# Patient Record
Sex: Male | Born: 1970 | Race: Black or African American | Hispanic: No | Marital: Single | Smoking: Current every day smoker
Health system: Southern US, Community
[De-identification: ages and names within clinical notes are randomized; demographics above are authoritative.]

---

## 2003-12-19 ENCOUNTER — Emergency Department (HOSPITAL_COMMUNITY): Admission: EM | Admit: 2003-12-19 | Discharge: 2003-12-19 | Payer: Self-pay | Admitting: Emergency Medicine

## 2004-05-18 ENCOUNTER — Emergency Department (HOSPITAL_COMMUNITY): Admission: EM | Admit: 2004-05-18 | Discharge: 2004-05-18 | Payer: Self-pay
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
## 2013-09-27 ENCOUNTER — Emergency Department (HOSPITAL_COMMUNITY)
Admission: EM | Admit: 2013-09-27 | Discharge: 2013-09-27 | Disposition: A | Discharge status: Home / Self Care | Payer: Self-pay

## 2013-09-27 ENCOUNTER — Encounter (HOSPITAL_COMMUNITY): Payer: Self-pay | Admitting: Emergency Medicine

## 2013-09-27 DIAGNOSIS — K047 Periapical abscess without sinus: Secondary | ICD-10-CM | POA: Insufficient documentation

## 2013-09-27 DIAGNOSIS — F17200 Nicotine dependence, unspecified, uncomplicated: Secondary | ICD-10-CM | POA: Insufficient documentation

## 2013-09-27 MED ORDER — OXYCODONE-ACETAMINOPHEN 5-325 MG PO TABS
2.0000 | ORAL_TABLET | Freq: Once | ORAL | Status: AC
  Administered 2013-09-27: 2 via ORAL
  Filled 2013-09-27: qty 2

## 2013-09-27 MED ORDER — OXYCODONE-ACETAMINOPHEN 5-325 MG PO TABS: 1.0000 | ORAL_TABLET | ORAL | Status: DC | PRN

## 2013-09-27 MED ORDER — PENICILLIN V POTASSIUM 500 MG PO TABS: 500.0000 mg | ORAL_TABLET | Freq: Four times a day (QID) | ORAL | Status: DC

## 2013-09-27 MED ORDER — IBUPROFEN 800 MG PO TABS: 800.0000 mg | ORAL_TABLET | Freq: Three times a day (TID) | ORAL | Status: DC

## 2013-09-27 NOTE — Progress Notes (Signed)
[facility] CL provided pt with a list of primary care resources, [place] [facility] application, and dental resources, to help patient establish primary care.

## 2013-09-27 NOTE — ED Provider Notes (Signed)
CSN: [id number]     Arrival date & time 09/27/13  1316 History  This chart was scribed for non-physician practitioner, Clabe SealLauren M Aneesh Faller, PA-C, working with Richardean Canalavid H Yao, MD by Charline BillsEssence Howell, ED Scribe. This patient was seen in room WTR8/WTR8 and the patient's care was started at 3:53 PM.   Chief Complaint  Patient presents with  . Dental Pain   HPI Comments: Eric GoltzWendell Denny is a 43 y.o. male who presents to the Emergency Department complaining of lower right dental pain onset 2 days ago. Pt reports associated facial swelling Pt suspects cavity in associated tooth. Pt has a dental appointment tomorrow. Pt has taken 800 mg ibuprofen for pain. No known allergies.   The history is provided by the patient. No language interpreter was used.   History reviewed. No pertinent past medical history. History reviewed. No pertinent past surgical history. History reviewed. No pertinent family history. History  Substance Use Topics  . Smoking status: Current Every Day Smoker  . Smokeless tobacco: Not on file  . Alcohol Use: No    Review of Systems  Constitutional: Negative for fever and chills.  HENT: Positive for dental problem and facial swelling.   Gastrointestinal: Negative for nausea and vomiting.  All other systems reviewed and are negative.  Allergies  Review of patient's allergies indicates no known allergies.  Home Medications   Prior to Admission medications   Medication Sig Start Date End Date Taking? Authorizing Provider  ibuprofen (ADVIL,MOTRIN) 200 MG tablet Take 800 mg by mouth.   Yes Historical Provider, MD   Triage Vitals: BP 119/86  Pulse 111  Temp(Src) 98.4 F (36.9 C) (Oral)  Resp 20  SpO2 100% Physical Exam  Nursing note and vitals reviewed. Constitutional: He is oriented to person, place, and time. He appears well-developed and well-nourished. He is cooperative.  Non-toxic appearance. He does not have a sickly appearance. He does not appear ill. No distress.  HENT:   Head: Normocephalic and atraumatic.  Mouth/Throat: Uvula is midline and oropharynx is clear and moist. No trismus in the jaw. No oropharyngeal exudate.  Tooth 31 gumline tender to palpation. Assoicated lower right dental swelling, no pointing or obvious drainage. Multiple fractured teeth.  Eyes: EOM are normal.  Neck: Normal range of motion. Neck supple.  Pulmonary/Chest: Effort normal. No respiratory distress.  Musculoskeletal: Normal range of motion.  Lymphadenopathy:    He has no cervical adenopathy.  Neurological: He is alert and oriented to person, place, and time.  Skin: Skin is warm and dry. He is not diaphoretic.  Psychiatric: He has a normal mood and affect. His behavior is normal.   ED Course  Procedures (including critical care time)   MDM   Final diagnoses:  Dental abscess   Patient with tooth pain and associated swelling to right lower jaw. No obvious pointing or fluctuance to palpation. Exam unconcerning for Ludwig's angina or spread of infection.  Will treat with penicillin and pain medicine.  Urged patient to follow-up with dentist, has an appointment tomorrow for further evaluation with a dentist.  Meds given in ED:  Medications  oxyCODONE-acetaminophen (PERCOCET/ROXICET) 5-325 MG per tablet 2 tablet (2 tablets Oral Given 09/27/13 1607)    Discharge Medication List as of 09/27/2013  4:02 PM    START taking these medications   Details  !! ibuprofen (ADVIL,MOTRIN) 800 MG tablet Take 1 tablet (800 mg total) by mouth 3 (three) times daily., Starting 09/27/2013, Until Discontinued, Print    oxyCODONE-acetaminophen (PERCOCET/ROXICET) 5-325 MG per  tablet Take 1 tablet by mouth every 4 (four) hours as needed for severe pain. May take 2 tablets PO q 6 hours for severe pain - Do not take with Tylenol as this tablet already contains tylenol, Starting 09/27/2013, Until Discontinued, Print    penicillin v potassium (VEETID) 500 MG tablet Take 1 tablet (500 mg total) by mouth  4 (four) times daily., Starting 09/27/2013, Until Discontinued, Print     !! - Potential duplicate medications found. Please discuss with provider.      I personally performed the services described in this documentation, which was scribed in my presence. The recorded information has been reviewed and is accurate.    Clabe SealLauren M Orell Hurtado, PA-C 09/29/13 [phone]

## 2013-09-27 NOTE — Discharge Instructions (Signed)
Call today for an appointment Dr. Mayford Knifeurner or another dentist. Call for a follow up appointment with a Family or Primary Care Provider.  Return if Symptoms worsen.   Take medication as prescribed.   Note that your pain medication contains acetaminophen (Tylenol) & its is not recommended that you use additional acetaminophen (Tylenol) while taking this medication.  Emergency Department Resource Guide 1) Find a Doctor and Pay Out of Pocket Although you won't have to find out who is covered by your insurance plan, it is a good idea to ask around and get recommendations. You will then need to call the office and see if the doctor you have chosen will accept you as a new patient and what types of options they offer for patients who are self-pay. Some doctors offer discounts or will set up payment plans for their patients who do not have insurance, but you will need to ask so you aren't surprised when you get to your appointment.  2) Contact Your Local Health Department Not all health departments have doctors that can see patients for sick visits, but many do, so it is worth a call to see if yours does. If you don't know where your local health department is, you can check in your phone book. The CDC also has a tool to help you locate your state's health department, and many state websites also have listings of all of their local health departments.  3) Find a Walk-in Clinic If your illness is not likely to be very severe or complicated, you may want to try a walk in clinic. These are popping up all over the country in pharmacies, drugstores, and shopping centers. They're usually staffed by nurse practitioners or physician assistants that have been trained to treat common illnesses and complaints. They're usually fairly quick and inexpensive. However, if you have serious medical issues or chronic medical problems, these are probably not your best option.  No Primary Care Doctor: - Call Health Connect at   [phone] - they can help you locate a primary care doctor that  accepts your insurance, provides certain services, etc. - Physician Referral Service- [phone]  Chronic Pain Problems: Organization         Address  Phone   Notes  [facility]  [phone]) [phone] Patients need to be referred by their primary care doctor.   Medication Assistance: Organization         Address  Phone   Notes  [facility] Medication [facility] [address] [place], [place] [address] [phone]) [phone] --Must be a resident of [facility] [place] -- Must have NO insurance coverage whatsoever (no Medicaid/ Medicare, etc.) -- The pt. MUST have a primary care doctor that directs their care regularly and follows them in the community   MedAssist  [phone]   [employer]  [phone]) [phone]    Agencies that provide inexpensive medical care: Organization         Address  Phone   Notes  [facility]  [phone]   [facility]    [phone]   [facility] [address] [place], [place] [address] [phone]   [facility] [address], [place] [phone]   Planned Parenthood    [phone]   [facility]    [phone](336) [phone]   [facility] and [facility]  [address], [place] Phone:  [phone], Fax:  (  [phone] Hours of Operation:  9 am - 6 pm, M-F.  Also accepts Medicaid/Medicare and self-pay.  [facility] [place] [address], [address], [place] Phone: [phone], Fax: [phone]. Hours of Operation:  8:30 am - 5:30 pm, M-F.  Also accepts Medicaid and self-pay.  [facility] [place] [address] [place] Phone: [phone]   [place], [place], [place] [phone], Ext. [phone] Mondays & Thursdays: 7-9 AM.  First 15 patients are seen on a first come, first serve basis.     [place] Providers:  Organization         Address  Phone   Notes  [facility] [address], [place] [phone] Also accepts self-pay patients.  [facility] [address], [place]  [phone]   [place], [address], [place] [phone]   [facility] [address], [place] [phone]   Lucianne Lei [address], [place]   [phone] Only accepts [place] Access [place] patients after they have their name applied to their card.   Self-Pay (no insurance) in [facility]:  Organization         Address  Phone   Notes  Sickle Cell Patients, [facility] [place] [phone]   [facility] [place] [phone]   [facility] [place]  [place], [place], [place] [phone]   [facility]/Dr. Osei-Bonsu  [address] [place] or [address], [place] [phone] Phone number for both [place] and [place] locations is the same.  [facility] ([facility]-Rh) [address], [place] [phone]   [facility] [address], [place] or [address] [phone]   [facility] [address] [place] [phone], phone; [phone], fax Sees patients 1st and 3rd Saturday of every month.  Must not qualify for public or private insurance (i.e. Medicaid, Medicare, [place] Health Choice, Veterans' Benefits)  Household income should be no more than 200% of the poverty level The clinic cannot treat you if you are pregnant or think you are pregnant  Sexually transmitted diseases are not treated at the clinic.    Dental Care: Organization         Address  Phone  Notes  [facility]  Department of [facility] [phone] Accepts children up to age 76 who are enrolled in [place] or [place]; pregnant women with a Medicaid card; and children who have applied for Medicaid or [place] Health Choice, but were declined, whose parents can pay a reduced fee at time of service.  [facility] of [facility]  [address], [place] [phone] Accepts children up to age 62 who are enrolled in [place] or [place]; pregnant women with a Medicaid card; and children who have applied for Medicaid or [place] Health Choice, but were declined, whose parents can pay a reduced fee at time of service.  [place] Adult [facility] Access PROGRAM  [place] [phone] Patients are seen by appointment only. Walk-ins are not accepted. [place]  [facility]ental will see patients 76 years of age and older. Monday - Tuesday (8am-5pm) Most Wednesdays (8:30-5pm) $30 per visit, cash only  [facility] Adult Dental Access PROGRAM  [address], [facility] [phone] Patients are seen by appointment only. Walk-ins are not accepted. [place] will see patients 23 years of age and older. One Wednesday Evening (Monthly: Volunteer Based).  $30 per visit, cash only  [place]  [phone] for adults; Children under age 29, call Graduate Pediatric Dentistry at [phone]. Children aged 14-14, please call [phone] to request a pediatric application.  Dental services are provided in all areas of dental care including fillings, crowns and bridges, complete and partial dentures, implants, gum treatment, root canals, and extractions. Preventive care is also provided. Treatment is provided to both adults and children. Patients are selected via a lottery and there is often a waiting list.   [facility] [address] [place]  [phone]  [email or web address]   [facility] [address] [place], [place] [phone], Ext. [phone] Second and Fourth Thursday of each month, opens at 6:30 AM; Clinic ends at 9 AM.  Patients are seen on a first-come first-served basis, and a limited number are seen during each clinic.   [facility]  [address] Hillard Danker [place], [place] [phone]   Eligibility Requirements You must have lived in [place], [place], or [place] counties for at least the last three months.   You cannot be eligible for state or federal sponsored [employer], including [employer], [place], or [employer].   You generally cannot be eligible for healthcare insurance through your employer.    How to apply: Eligibility screenings are held every Tuesday and Wednesday afternoon from 1:00 pm until 4:00 pm. You do not need an appointment for the interview!  [facility] [address] [place], [place]   [place]  [place] Department  [place]  [phone]    Behavioral Health Resources in the Community: Intensive Outpatient Programs Organization         Address  Phone  Notes  [place]. [address], [place], [place] [phone]   [facility] Outpatient [address] [place], [place]   ADS: Alcohol & Drug Svcs [address], [place], [place]   [place] [address]  [place], [place] or [phone]   Substance Abuse Resources Organization         Address  Phone  Notes  Alcohol and Drug Services  [phone]   [place]  [phone]   The [place]   Chinita Pester  [phone]   Residential & Outpatient Substance Abuse Program  [phone]   Psychological Services Organization          Address  Phone  Notes  [facility] [place]  [place]  [phone]   [place] [address] [place] or [phone]    Mobile Crisis Teams Organization         Address  Phone  Notes  [facility], Mobile Crisis Care Unit  [phone]   [facility]  [address] [place], [place]   [facility] [address] [place] [phone]    Self-Help/Support Groups Organization  Address  Phone             Notes  [facility] Assoc[facility] of [place] - variety of support groups  336- [phone] Call for more information  Narcotics Anonymous (NA), Caring [facility] [address], [place]  2 meetings at this location   Statisticianesidential Treatment Programs Organization         Address  Phone  Notes  [facility] [address],    [place]  [phone]   [facility]  [address], [place] [phone], [place], [place] [phone]   [facility] [address], [place] [phone] Admissions: 8am-3pm M-F  [facility] [address]    [place], [place] [phone]   [facility] [address], [place], [place] [phone]   [facility] [address]  [place], [place] [phone]   [facility] - Intensive Outpatient [address], [place], [place] [phone]   [facility] (Addiction Recovery Care Assoc[facility] [address]  [place], [place] [phone] or [phone]   [facility] Services (RTS) [address] [place], [place] [phone] Accepts Medicaid  [facility] [address]  [place] [phone] Substance Abuse/Addiction Treatment   [facility] Behavioral Health Resources Organization         Address  Phone  Notes  [facility]  [phone]) [phone]   Angie FavaJulie Brannon, PhD [address], [address] [place], [place]   [phone]) [phone] or [phone](336) [phone]   [facility]   [address] [place], [place] [phone](336) [phone]   [facility] [address], [place], [place] [phone]) [phone] Insurance/Medicaid/sponsorship through [facility]  Faith and Families [address]                                    [place], [place] [phone]) [phone] Therapy/tele-psych/case  [facility] [address]   [place], [place] [phone]) [phone]    Dr. Lolly MustacheArfeen  [phone]) [phone]   [facility] [place]  United Way [facility] 1) [address], [place] 2) [address], [place] 3)  [address], [place] [phone]) [phone]) [phone]  [phone](336) [phone]   [facility] Child Abuse Hotline [phone](336) [phone] or [phone](336) [phone] (After Hours)

## 2013-09-27 NOTE — ED Notes (Signed)
Pt has a ride home.  

## 2013-09-27 NOTE — ED Notes (Signed)
Pt c/o dental pain x 2 days.  

## 2013-10-02 NOTE — ED Provider Notes (Signed)
Medical screening examination/treatment/procedure(s) were performed by non-physician practitioner and as supervising physician I was immediately available for consultation/collaboration.   EKG Interpretation None        [name] N Miria Cappelli, DO 10/02/13 1657 

## 2017-06-15 ENCOUNTER — Ambulatory Visit (HOSPITAL_COMMUNITY)
Admission: EM | Admit: 2017-06-15 | Discharge: 2017-06-15 | Disposition: A | Discharge status: Home / Self Care | Payer: Self-pay | Attending: Emergency Medicine | Admitting: Emergency Medicine

## 2017-06-15 ENCOUNTER — Encounter (HOSPITAL_COMMUNITY): Payer: Self-pay

## 2017-06-15 ENCOUNTER — Other Ambulatory Visit: Payer: Self-pay

## 2017-06-15 DIAGNOSIS — K219 Gastro-esophageal reflux disease without esophagitis: Secondary | ICD-10-CM

## 2017-06-15 MED ORDER — OMEPRAZOLE 20 MG PO CPDR: 20.0000 mg | DELAYED_RELEASE_CAPSULE | Freq: Every day | ORAL | 0 refills | Status: AC

## 2017-06-15 NOTE — ED Provider Notes (Signed)
[facility]    CSN: [id number] Arrival date & time: 06/15/17  1532     History   Chief Complaint Chief Complaint  Patient presents with  . Abdominal Pain    HPI Shirlyn GoltzWendell Monfort is a 47 y.o. male presents to the urgent care facility for evaluation of intermittent epigastric pain he describes as burning.  Burning will occasionally occur up into his throat.  He states he has a hoarse voice.  He denies any chest pain, shortness of breath or diaphoresis.  Symptoms are increased with lying down.  He gets relief with drinking water.  Occasionally symptoms are increased after eating.  He denies any fevers.  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  Is currently not having any pain or discomfort.  Denies any nausea or vomiting.  Denies any cardiac issues.  He has not taken any medications for his symptoms. HPI  History reviewed. No pertinent past medical history.  There are no active problems to display for this patient.   History reviewed. No pertinent surgical history.     Home Medications    Prior to Admission medications   Medication Sig Start Date End Date Taking? Authorizing Provider  ibuprofen (ADVIL,MOTRIN) 200 MG tablet Take 800 mg by mouth.    [provider]  ibuprofen (ADVIL,MOTRIN) 800 MG tablet Take 1 tablet (800 mg total) by mouth 3 (three) times daily. 09/27/13   Mellody DrownParker, Lauren, PA-C  omeprazole (PRILOSEC) 20 MG capsule Take 1 capsule (20 mg total) by mouth daily. 06/15/17   Evon SlackGaines, Elah Avellino C, PA-C  oxyCODONE-acetaminophen (PERCOCET/ROXICET) 5-325 MG per tablet Take 1 tablet by mouth every 4 (four) hours as needed for severe pain. May take 2 tablets PO q 6 hours for severe pain - Do not take with Tylenol as this tablet already contains tylenol 09/27/13   Mellody DrownParker, Lauren, PA-C  penicillin v potassium (VEETID) 500 MG tablet Take 1 tablet (500 mg total) by mouth 4 (four) times daily. 09/27/13    Mellody DrownParker, Lauren, PA-C    Family History History reviewed. No pertinent family history.  Social History Social History   Tobacco Use  . Smoking status: Current Every Day Smoker  . Smokeless tobacco: Never Used  Substance Use Topics  . Alcohol use: No  . Drug use: No     Allergies   Patient has no known allergies.   Review of Systems Review of Systems  Constitutional: Negative.  Negative for activity change, appetite change, chills and fever.  HENT: Positive for voice change. Negative for congestion, ear pain, mouth sores, rhinorrhea, sinus pressure, sore throat and trouble swallowing.   Eyes: Negative for photophobia, pain and discharge.  Respiratory: Negative for cough, chest tightness and shortness of breath.   Cardiovascular: Negative for chest pain and leg swelling.  Gastrointestinal: Negative for abdominal distention, abdominal pain, diarrhea, nausea and vomiting.  Genitourinary: Negative for difficulty urinating, discharge, dysuria and testicular pain.  Musculoskeletal: Negative for arthralgias, back pain and gait problem.  Skin: Negative for color change and rash.  Neurological: Negative for dizziness and headaches.  Hematological: Negative for adenopathy.  Psychiatric/Behavioral: Negative for agitation and behavioral problems.     Physical Exam Triage Vital Signs ED Triage Vitals  Enc Vitals Group     BP 06/15/17 1759 132/88     Pulse Rate 06/15/17 1759 74     Resp 06/15/17 1759 16     Temp 06/15/17 1759 98.1 F (36.7  C)     Temp Source 06/15/17 1759 Oral     SpO2 06/15/17 1759 98 %     Weight --      Height --      Head Circumference --      Peak Flow --      Pain Score 06/15/17 1757 8     Pain Loc --      Pain Edu? --      Excl. in [place]? --    No data found.  Updated Vital Signs BP 132/88 (BP Location: Left Arm)   Pulse 74   Temp 98.1 F (36.7 C) (Oral)   Resp 16   SpO2 98%   Visual Acuity Right Eye Distance:   Left Eye Distance:     Bilateral Distance:    Right Eye Near:   Left Eye Near:    Bilateral Near:     Physical Exam  Constitutional: He appears well-developed and well-nourished.  HENT:  Head: Normocephalic and atraumatic.  Mouth/Throat: Oropharynx is clear and moist.  Eyes: Conjunctivae are normal.  Neck: Neck supple.  Cardiovascular: Normal rate, regular rhythm and normal heart sounds.  No murmur heard. Pulmonary/Chest: Effort normal and breath sounds normal. No respiratory distress.  Abdominal: Soft. Bowel sounds are normal. He exhibits no distension and no mass. There is no tenderness. There is no rebound and no guarding. No hernia.  Musculoskeletal: He exhibits no edema.  Neurological: He is alert.  Skin: Skin is warm and dry.  Psychiatric: He has a normal mood and affect.  Nursing note and vitals reviewed.    UC Treatments / Results  Labs (all labs ordered are listed, but only abnormal results are displayed) Labs Reviewed - No data to display  EKG  EKG Interpretation None       Radiology No results found.  Procedures Procedures (including critical care time)  Medications Ordered in UC Medications - No data to display   Initial Impression / Assessment and Plan / UC Course  I have reviewed the triage vital signs and the nursing notes.  Pertinent labs & imaging results that were available during my care of the patient were reviewed by me and considered in my medical decision making (see chart for details).     46 year old male with intermittent epigastric pain and burning sensation.  Is currently asymptomatic vital signs are normal.  No increase in symptoms with exertion, no chest pain, shortness of breath or diaphoresis.  His symptoms are alleviated with water.  Symptoms are exacerbated with lying down.  Patient started on a proton pump inhibitor.  He is educated on signs and symptoms return to clinic for.  Recommend palpation follow-up with PCP in 1 week for recheck.  Final  Clinical Impressions(s) / UC Diagnoses   Final diagnoses:  Gastroesophageal reflux disease without esophagitis    ED Discharge Orders        Ordered    omeprazole (PRILOSEC) 20 MG capsule  Daily     06/15/17 1826        Evon Slack, [place] 06/15/17 1837

## 2017-06-15 NOTE — ED Triage Notes (Signed)
Patient presents to [facility] for abdominal pain x6 days, pt denies any injuries, normal bowel movements.

## 2017-06-15 NOTE — Discharge Instructions (Signed)
Please take medication as prescribed.  Avoid foods that cause reflux.  Follow-up with primary care provider in 1 week for recheck.  Return to the urgent care or ER for any increasing pain, fevers, nausea, vomiting, worsening symptoms or changes in her health.

## 2020-04-25 ENCOUNTER — Other Ambulatory Visit: Payer: Self-pay

## 2020-04-25 ENCOUNTER — Ambulatory Visit
Admission: EM | Admit: 2020-04-25 | Discharge: 2020-04-25 | Disposition: A | Discharge status: Home / Self Care | Payer: Managed Care, Other (non HMO) | Attending: Emergency Medicine | Admitting: Emergency Medicine

## 2020-04-25 DIAGNOSIS — K047 Periapical abscess without sinus: Secondary | ICD-10-CM

## 2020-04-25 DIAGNOSIS — R220 Localized swelling, mass and lump, head: Secondary | ICD-10-CM

## 2020-04-25 MED ORDER — AMOXICILLIN-POT CLAVULANATE 875-125 MG PO TABS: 1.0000 | ORAL_TABLET | Freq: Two times a day (BID) | ORAL | 0 refills | Status: AC

## 2020-04-25 MED ORDER — IBUPROFEN 800 MG PO TABS: 800.0000 mg | ORAL_TABLET | Freq: Three times a day (TID) | ORAL | 0 refills | Status: AC

## 2020-04-25 NOTE — ED Provider Notes (Signed)
[facility]    CSN: [id number] Arrival date & time: 04/25/20  1517      History   Chief Complaint Chief Complaint  Patient presents with   Jaw Pain    X 3 days    HPI Eric Noble is a 49 y.o. male presenting today for evaluation of jaw pain and swelling.  Reports over the past couple days he has had a toothache around his left lower jaw.  Reports broken tooth in this area.  Has noticed increased swelling underneath his jaw in the same area of recently.  Denies fevers.  Does report some difficulty swallowing.  Denies difficulty breathing.  Denies neck stiffness.  HPI  History reviewed. No pertinent past medical history.  There are no problems to display for this patient.   History reviewed. No pertinent surgical history.     Home Medications    Prior to Admission medications   Medication Sig Start Date End Date Taking? Authorizing Provider  amoxicillin-clavulanate (AUGMENTIN) 875-125 MG tablet Take 1 tablet by mouth every 12 (twelve) hours for 7 days. 04/25/20 05/02/20  Zed Wanninger C, PA-C  ibuprofen (ADVIL) 800 MG tablet Take 1 tablet (800 mg total) by mouth 3 (three) times daily. 04/25/20   Shatarra Wehling C, PA-C  omeprazole (PRILOSEC) 20 MG capsule Take 1 capsule (20 mg total) by mouth daily. 06/15/17   Evon Slack, PA-C    Family History History reviewed. No pertinent family history.  Social History Social History   Tobacco Use   Smoking status: Current Every Day Smoker   Smokeless tobacco: Never Used  Building services engineer Use: Never used  Substance Use Topics   Alcohol use: No   Drug use: No     Allergies   Patient has no known allergies.   Review of Systems Review of Systems  Constitutional: Negative for activity change, appetite change, chills, fatigue and fever.  HENT: Positive for dental problem and facial swelling. Negative for congestion, ear pain, rhinorrhea, sinus pressure, sore throat and trouble swallowing.    Eyes: Negative for discharge and redness.  Respiratory: Negative for cough, chest tightness and shortness of breath.   Cardiovascular: Negative for chest pain.  Gastrointestinal: Negative for abdominal pain, diarrhea, nausea and vomiting.  Musculoskeletal: Negative for myalgias.  Skin: Negative for rash.  Neurological: Negative for dizziness, light-headedness and headaches.     Physical Exam Triage Vital Signs ED Triage Vitals [04/25/20 1539]  Enc Vitals Group     BP (!) 131/91     Pulse Rate 91     Resp 18     Temp 98.3 F (36.8 C)     Temp Source Oral     SpO2 97 %     Weight      Height      Head Circumference      Peak Flow      Pain Score      Pain Loc      Pain Edu?      Excl. in [place]?    No data found.  Updated Vital Signs BP (!) 131/91 (BP Location: Left Arm)    Pulse 91    Temp 98.3 F (36.8 C) (Oral)    Resp 18    SpO2 97%   Visual Acuity Right Eye Distance:   Left Eye Distance:   Bilateral Distance:    Right Eye Near:   Left Eye Near:    Bilateral Near:     Physical Exam  Vitals and nursing note reviewed.  Constitutional:      Appearance: He is well-developed and well-nourished.     Comments: No acute distress  HENT:     Head: Normocephalic and atraumatic.     Comments: Mild swelling noted just underneath submandibular line near tonsillar area, no palpable abscess or induration noted    Ears:     Comments: Bilateral ears without tenderness to palpation of external auricle, tragus and mastoid, EAC's without erythema or swelling, TM's with good bony landmarks and cone of light. Non erythematous.     Nose: Nose normal.     Mouth/Throat:     Comments: Fractured tooth down to root to left lower posterior molar, surrounding gingival swelling and erythema, no soft palate swelling, posterior pharynx patent Eyes:     Conjunctiva/sclera: Conjunctivae normal.  Neck:     Comments: Full active range of motion of neck, no overlying erythema or swelling  noted Cardiovascular:     Rate and Rhythm: Normal rate.  Pulmonary:     Effort: Pulmonary effort is normal. No respiratory distress.  Abdominal:     General: There is no distension.  Musculoskeletal:        General: Normal range of motion.     Cervical back: Neck supple.  Skin:    General: Skin is warm and dry.  Neurological:     Mental Status: He is alert and oriented to person, place, and time.  Psychiatric:        Mood and Affect: Mood and affect normal.      UC Treatments / Results  Labs (all labs ordered are listed, but only abnormal results are displayed) Labs Reviewed - No data to display  EKG   Radiology No results found.  Procedures Procedures (including critical care time)  Medications Ordered in UC Medications - No data to display  Initial Impression / Assessment and Plan / UC Course  I have reviewed the triage vital signs and the nursing notes.  Pertinent labs & imaging results that were available during my care of the patient were reviewed by me and considered in my medical decision making (see chart for details).     Covering for dental abscess with Augmentin and recommending anti-inflammatories.  Warm compresses.  No sign of sialadenitis or Ludwig's angina at this time, but continue to monitor.  Patient to follow-up in emergency room if symptoms not resolving with oral antibiotics and developing increased swelling neck stiffness or fevers.  Discussed strict return precautions. Patient verbalized understanding and is agreeable with plan.  Final Clinical Impressions(s) / UC Diagnoses   Final diagnoses:  Jaw swelling  Dental abscess     Discharge Instructions     Augmentin twice daily for 1 week Use anti-inflammatories for pain/swelling. You may take up to 800 mg Ibuprofen every 8 hours with food. You may supplement Ibuprofen with Tylenol [phone] mg every 8 hours.  Warm compresses Follow up in emergency room if worsening,developing fevers,  increased swelling, neck stiffness     ED Prescriptions    Medication Sig Dispense Auth. Provider   amoxicillin-clavulanate (AUGMENTIN) 875-125 MG tablet Take 1 tablet by mouth every 12 (twelve) hours for 7 days. 14 tablet Mikesha Migliaccio C, PA-C   ibuprofen (ADVIL) 800 MG tablet Take 1 tablet (800 mg total) by mouth 3 (three) times daily. 21 tablet Cherrie Franca, [place] C, PA-C     PDMP not reviewed this encounter.   Lew Dawes, [place] 04/25/20 1637

## 2020-04-25 NOTE — ED Triage Notes (Signed)
Patient states jaw pain began with a tooth ache for 3 days and now patient has no dental pain but does have mild swelling on his left side of his jaw on the bottom part and has pain towards the back of his jaw. Pt is aox4 and ambulatory.

## 2020-04-25 NOTE — Discharge Instructions (Signed)
Augmentin twice daily for 1 week Use anti-inflammatories for pain/swelling. You may take up to 800 mg Ibuprofen every 8 hours with food. You may supplement Ibuprofen with Tylenol [phone] mg every 8 hours.  Warm compresses Follow up in emergency room if worsening,developing fevers, increased swelling, neck stiffness

## 2022-07-21 ENCOUNTER — Ambulatory Visit
Admission: EM | Admit: 2022-07-21 | Discharge: 2022-07-21 | Discharge status: Against Medical Advice | Payer: Managed Care, Other (non HMO) | Attending: Family Medicine | Admitting: Family Medicine

## 2022-07-22 ENCOUNTER — Ambulatory Visit
Admission: EM | Admit: 2022-07-22 | Discharge: 2022-07-22 | Disposition: A | Discharge status: Home / Self Care | Payer: Managed Care, Other (non HMO) | Attending: Internal Medicine | Admitting: Internal Medicine

## 2022-07-22 DIAGNOSIS — M5442 Lumbago with sciatica, left side: Secondary | ICD-10-CM | POA: Diagnosis not present

## 2022-07-22 DIAGNOSIS — M5441 Lumbago with sciatica, right side: Secondary | ICD-10-CM | POA: Diagnosis not present

## 2022-07-22 MED ORDER — PREDNISONE 20 MG PO TABS: 40.0000 mg | ORAL_TABLET | Freq: Every day | ORAL | 0 refills | Status: AC

## 2022-07-22 NOTE — Discharge Instructions (Signed)
It appears that you have sciatica with low back pain.  I have prescribed prednisone to decrease information.  Take this with food to avoid stomach upset.  Follow-up if any symptoms persist or worsen.  You may go next-door to primary care to schedule appointment to establish care with family medicine.

## 2022-07-22 NOTE — ED Triage Notes (Signed)
Pt c/o tingling starting in right hip going down back of right leg into the foot.   Onset ~ Wednesday

## 2022-07-22 NOTE — ED Provider Notes (Signed)
[facility]    CSN: [id number] Arrival date & time: 07/22/22  1624      History   Chief Complaint Chief Complaint  Patient presents with   Sciatica    HPI Eric Noble is a 52 y.o. male.   Patient presents with low back pain and numbness and tingling of lower extremities.  Patient reports that he has left lower back pain that radiates down the bilateral lower extremities with associated numbness and tingling.  He denies any obvious injury to the area but reports that he does work at [employer] and does a lot of heavy lifting on a daily basis.  Denies any history of chronic back pain.  Has taken ibuprofen a few times with temporary improvement in symptoms.  Denies urinary frequency, urinary or bowel continence, saddle anesthesia.     History reviewed. No pertinent past medical history.  There are no problems to display for this patient.   History reviewed. No pertinent surgical history.     Home Medications    Prior to Admission medications   Medication Sig Start Date End Date Taking? Authorizing Provider  predniSONE (DELTASONE) 20 MG tablet Take 2 tablets (40 mg total) by mouth daily for 5 days. 07/22/22 07/27/22 Yes Mory Herrman, Michele Rockers, FNP  ibuprofen (ADVIL) 800 MG tablet Take 1 tablet (800 mg total) by mouth 3 (three) times daily. 04/25/20   Wieters, Hallie C, PA-C  omeprazole (PRILOSEC) 20 MG capsule Take 1 capsule (20 mg total) by mouth daily. 06/15/17   Duanne Guess, PA-C    Family History History reviewed. No pertinent family history.  Social History Social History   Tobacco Use   Smoking status: Every Day   Smokeless tobacco: Never  Vaping Use   Vaping Use: Never used  Substance Use Topics   Alcohol use: No   Drug use: No     Allergies   Patient has no known allergies.   Review of Systems Review of Systems Per HPI  Physical Exam Triage Vital Signs ED Triage Vitals [07/22/22 1632]  Enc Vitals Group     BP (!) 158/97     Pulse Rate 83      Resp 16     Temp 97.8 F (36.6 C)     Temp Source Oral     SpO2 97 %     Weight      Height      Head Circumference      Peak Flow      Pain Score 0     Pain Loc      Pain Edu?      Excl. in [place]?    No data found.  Updated Vital Signs BP (!) 158/97 (BP Location: Right Arm)   Pulse 83   Temp 97.8 F (36.6 C) (Oral)   Resp 16   SpO2 97%   Visual Acuity Right Eye Distance:   Left Eye Distance:   Bilateral Distance:    Right Eye Near:   Left Eye Near:    Bilateral Near:     Physical Exam Constitutional:      General: He is not in acute distress.    Appearance: Normal appearance. He is not toxic-appearing or diaphoretic.  HENT:     Head: Normocephalic and atraumatic.  Eyes:     Extraocular Movements: Extraocular movements intact.     Conjunctiva/sclera: Conjunctivae normal.  Pulmonary:     Effort: Pulmonary effort is normal.  Musculoskeletal:     Lumbar  back: No swelling or edema. Positive right straight leg raise test and positive left straight leg raise test.       Back:     Comments: Tenderness to palpation to left lower lumbar region.  No direct spinal tenderness, crepitus, step-off noted.  No swelling or discoloration noted.  Neurological:     General: No focal deficit present.     Mental Status: He is alert and oriented to person, place, and time. Mental status is at baseline.  Psychiatric:        Mood and Affect: Mood normal.        Behavior: Behavior normal.        Thought Content: Thought content normal.        Judgment: Judgment normal.      UC Treatments / Results  Labs (all labs ordered are listed, but only abnormal results are displayed) Labs Reviewed - No data to display  EKG   Radiology No results found.  Procedures Procedures (including critical care time)  Medications Ordered in UC Medications - No data to display  Initial Impression / Assessment and Plan / UC Course  I have reviewed the triage vital signs and the nursing  notes.  Pertinent labs & imaging results that were available during my care of the patient were reviewed by me and considered in my medical decision making (see chart for details).     Physical exam and patient's symptoms are consistent with back pain with sciatica.  Given no direct spinal tenderness or injury, will defer imaging.  Will treat with prednisone to decrease inflammation.  Advised supportive care and symptom management.  Advised strict return precautions.  Patient verbalized understanding and was agreeable with plan. Final Clinical Impressions(s) / UC Diagnoses   Final diagnoses:  Acute left-sided low back pain with bilateral sciatica     Discharge Instructions      It appears that you have sciatica with low back pain.  I have prescribed prednisone to decrease information.  Take this with food to avoid stomach upset.  Follow-up if any symptoms persist or worsen.  You may go next-door to primary care to schedule appointment to establish care with family medicine.    ED Prescriptions     Medication Sig Dispense Auth. Provider   predniSONE (DELTASONE) 20 MG tablet Take 2 tablets (40 mg total) by mouth daily for 5 days. 10 tablet Teodora Medici, [place]      PDMP not reviewed this encounter.   Teodora Medici, [place] 07/22/22 [phone]

## 2022-07-29 ENCOUNTER — Ambulatory Visit
Admission: EM | Admit: 2022-07-29 | Discharge: 2022-07-29 | Disposition: A | Discharge status: Home / Self Care | Payer: 59 | Attending: Family Medicine | Admitting: Family Medicine

## 2022-07-29 DIAGNOSIS — M5441 Lumbago with sciatica, right side: Secondary | ICD-10-CM | POA: Diagnosis not present

## 2022-07-29 MED ORDER — CYCLOBENZAPRINE HCL 10 MG PO TABS: ORAL_TABLET | ORAL | 0 refills | Status: AC

## 2022-07-29 MED ORDER — PREDNISONE 10 MG (48) PO TBPK: ORAL_TABLET | ORAL | 0 refills | Status: AC

## 2022-07-29 NOTE — Discharge Instructions (Addendum)

## 2022-07-29 NOTE — ED Triage Notes (Signed)
Pt c/o lower right back pain moving down the right leg. States was here recently for this and given steroids that helped while taking but once stopped the back pain returned.

## 2022-07-29 NOTE — ED Provider Notes (Signed)
[facility]   [id number] 07/29/22 Arrival Time: [id number]  ASSESSMENT & PLAN:  1. Acute right-sided low back pain with right-sided sciatica    Able to ambulate here and hemodynamically stable. No indication for imaging of back at this time given no trauma and normal neurological exam.  Work note provided.  Meds ordered this encounter  Medications   predniSONE (STERAPRED UNI-PAK 48 TAB) 10 MG (48) TBPK tablet    Sig: Take as directed.    Dispense:  48 tablet    Refill:  0   cyclobenzaprine (FLEXERIL) 10 MG tablet    Sig: Take 1 tablet by mouth before bed as needed for muscle spasm. Warning: May cause drowsiness.    Dispense:  10 tablet    Refill:  0   Medication sedation precautions given. Encourage ROM/movement as tolerated.  Recommend:  Follow-up Information     Schedule an appointment as soon as possible for a visit  with [place].   Contact information: [address] [place] [phone]                Reviewed expectations re: course of current medical issues. Questions answered. Outlined signs and symptoms indicating need for more acute intervention. Patient verbalized understanding. After Visit Summary given.   SUBJECTIVE: History from: patient. Seen here on 07/22/22; note reviewed. Feels prednisone was helping; now pain has returned. Eric Noble is a 52 y.o. male who presents with complaint of fairly persistent right sided lower back pain. Onset 1-2 w ago; started while working at [employer]. Denies trauma. Pain described as aching and with radiation down R leg; sharp pain . Aggravating factors: certain movements and prolonged walking/standing. Alleviating factors: have not been identified. Progressive LE weakness or saddle anesthesia: none. Extremity sensation changes or weakness: none. Ambulatory without difficulty. Normal bowel/bladder habits: yes; without urinary retention. Normal PO  intake without n/v. No associated abdominal pain/n/v.   OBJECTIVE:  Vitals:   07/29/22 1543  BP: (!) 158/97  Pulse: 63  Resp: 18  Temp: 98 F (36.7 C)  TempSrc: Oral  SpO2: 98%    General appearance: alert; no distress HEENT: [place]; AT Neck: supple with FROM; without midline tenderness CV: regular Lungs: unlabored respirations; speaks full sentences without difficulty Abdomen: soft, non-tender; non-distended Back: moderate and poorly localized tenderness to palpation over R lumbar musculature/SI joint distribution ; FROM at waist; bruising: none; without midline tenderness Extremities: without edema; symmetrical without gross deformities; normal ROM of bilateral LE Skin: warm and dry Neurologic: normal gait; normal sensation and strength of bilateral LE Psychological: alert and cooperative; normal mood and affect  No Known Allergies  History reviewed. No pertinent past medical history. Social History   Socioeconomic History   Marital status: Single    Spouse name: Not on file   Number of children: Not on file   Years of education: Not on file   Highest education level: Not on file  Occupational History   Not on file  Tobacco Use   Smoking status: Every Day   Smokeless tobacco: Never  Vaping Use   Vaping Use: Never used  Substance and Sexual Activity   Alcohol use: No   Drug use: No   Sexual activity: Yes  Other Topics Concern   Not on file  Social History Narrative   Not on file   Social Determinants of Health   Financial Resource Strain: Not on file  Food Insecurity: Not on file  Transportation Needs: Not on file  Physical Activity: Not on file  Stress: Not on file  Social Connections: Not on file  Intimate Partner Violence: Not on file   History reviewed. No pertinent family history. History reviewed. No pertinent surgical history.    Vanessa Kick, MD 07/29/22 [phone]

## 2022-08-11 ENCOUNTER — Other Ambulatory Visit: Payer: Self-pay | Admitting: Emergency Medicine

## 2022-08-11 DIAGNOSIS — M545 Low back pain, unspecified: Secondary | ICD-10-CM

## 2022-08-13 ENCOUNTER — Encounter (HOSPITAL_COMMUNITY): Payer: Self-pay

## 2022-08-13 ENCOUNTER — Emergency Department (HOSPITAL_COMMUNITY): Payer: 59

## 2022-08-13 ENCOUNTER — Other Ambulatory Visit: Payer: Self-pay

## 2022-08-13 ENCOUNTER — Emergency Department (HOSPITAL_COMMUNITY)
Admission: EM | Admit: 2022-08-13 | Discharge: 2022-08-14 | Disposition: A | Discharge status: Home / Self Care | Payer: 59 | Attending: Emergency Medicine | Admitting: Emergency Medicine

## 2022-08-13 DIAGNOSIS — M48061 Spinal stenosis, lumbar region without neurogenic claudication: Secondary | ICD-10-CM | POA: Diagnosis not present

## 2022-08-13 DIAGNOSIS — M545 Low back pain, unspecified: Secondary | ICD-10-CM | POA: Diagnosis present

## 2022-08-13 NOTE — ED Notes (Signed)
Pt has returned from MRI. 

## 2022-08-13 NOTE — ED Provider Notes (Signed)
[place] EMERGENCY DEPARTMENT AT [facility] : [facility] Provider Note   CSN: [id number] Arrival date & time: 08/13/22  1654     History  Chief Complaint  Patient presents with   Back Pain    Eric Noble is a 52 y.o. male with no past medical history presents to the ED complaining of lower back pain, decreased sensation, and weakness in his right leg.  Patient states that he began to have lower back pain after a work injury on March 6.  He was evaluated at urgent care for this and diagnosed with sciatica.  He has been taking prednisone and Flexeril but symptoms have not resolved.  He states the pain in his lower back is not excruciating, however, he has had worsening symptoms including feeling like he is weak in his right leg when he tries to walk and decrease sensation from the right side of his mid abdomen all the way down his right leg.  Patient denies fever, chills, bowel or bladder dysfunction, dysuria, hematuria, abdominal pain, chest pain, or shortness of breath.  He denies history of malignancy or IV drug use.  He denies previous history of problems with his back.      Home Medications Prior to Admission medications   Medication Sig Start Date End Date Taking? Authorizing Provider  cyclobenzaprine (FLEXERIL) 10 MG tablet Take 1 tablet by mouth before bed as needed for muscle spasm. Warning: May cause drowsiness. 07/29/22   Mardella Layman, MD  ibuprofen (ADVIL) 800 MG tablet Take 1 tablet (800 mg total) by mouth 3 (three) times daily. 04/25/20   Wieters, Hallie C, PA-C  omeprazole (PRILOSEC) 20 MG capsule Take 1 capsule (20 mg total) by mouth daily. 06/15/17   Evon Slack, PA-C  predniSONE (STERAPRED UNI-PAK 48 TAB) 10 MG (48) TBPK tablet Take as directed. 07/29/22   Mardella Layman, MD      Allergies    Patient has no known allergies.    Review of Systems   Review of Systems  All other systems reviewed and are negative.   Physical Exam Updated Vital Signs BP (!) 148/86    Pulse 78   Temp 97.7 F (36.5 C) (Oral)   Resp 17   Ht 5\' 11"  (1.803 m)   Wt 72.6 kg   SpO2 100%   BMI 22.32 kg/m  Physical Exam Vitals and nursing note reviewed.  Constitutional:      General: He is not in acute distress.    Appearance: Normal appearance.  HENT:     Head: Normocephalic and atraumatic.     Mouth/Throat:     Mouth: Mucous membranes are moist.  Eyes:     Extraocular Movements: Extraocular movements intact.     Conjunctiva/sclera: Conjunctivae normal.     Pupils: Pupils are equal, round, and reactive to light.  Neck:     Comments: No midline cervical tenderness, step-offs, or deformities Cardiovascular:     Rate and Rhythm: Normal rate and regular rhythm.     Heart sounds: No murmur heard. Pulmonary:     Effort: Pulmonary effort is normal.     Breath sounds: Normal breath sounds.  Abdominal:     General: Abdomen is flat.     Palpations: Abdomen is soft.     Tenderness: There is no abdominal tenderness. There is no guarding or rebound.  Musculoskeletal:        General: Normal range of motion.     Cervical back: Normal range of motion and neck supple.  No rigidity or tenderness.     Right lower leg: No edema.     Left lower leg: No edema.     Comments: Minimal tenderness over L5-S1, no tenderness to remainder of midline spine, no stepoffs or deformities, 5/5 strength to the left upper and left lower extremity, 4/5 strength to the right lower extremity, 4+/5 slightly decreased grip strength on the right compared to the left  Skin:    General: Skin is warm and dry.     Capillary Refill: Capillary refill takes less than 2 seconds.  Neurological:     Mental Status: He is alert and oriented to person, place, and time.     GCS: GCS eye subscore is 4. GCS verbal subscore is 5. GCS motor subscore is 6.     Cranial Nerves: Cranial nerves 2-12 are intact. No cranial nerve deficit, dysarthria or facial asymmetry.     Sensory: Sensory deficit (pt reports diminished  sensation to right side of trunk and right lower extremity) present.     Deep Tendon Reflexes:     Reflex Scores:      Patellar reflexes are 2+ on the right side and 2+ on the left side. Psychiatric:        Behavior: Behavior normal.     ED Results / Procedures / Treatments   Labs (all labs ordered are listed, but only abnormal results are displayed) Labs Reviewed - No data to display  EKG None  Radiology MR THORACIC SPINE WO CONTRAST  Result Date: 08/13/2022 CLINICAL DATA:  Concern for spinal cord compression, work injury EXAM: MRI THORACIC AND LUMBAR SPINE WITHOUT CONTRAST TECHNIQUE: Multiplanar and multiecho pulse sequences of the thoracic and lumbar spine were obtained without intravenous contrast. COMPARISON:  None Available. FINDINGS: MRI THORACIC SPINE FINDINGS Alignment: No significant listhesis. Preservation of the normal thoracic kyphosis. Mild S-shaped curvature of the thoracolumbar spine. Vertebrae: No acute fracture, evidence of discitis, or suspicious osseous lesion. Cord:  Normal signal and morphology.  No epidural collection. Paraspinal and other soft tissues: Negative. Disc levels: Minimal disc bulges or protrusions at T1-T2, T2-T3, T3-T4, T6-T7, T8-T9, and T11-T12, and mild facet arthropathy at T9-T10, T10-T11, and T11-T12, without significant spinal canal stenosis. Mild right neural foraminal narrowing at T11-T12. MRI LUMBAR SPINE FINDINGS Segmentation:  5 lumbar type vertebral bodies. Alignment:  No significant listhesis.  Mild levocurvature. Vertebrae: No acute fracture, evidence of discitis, or suspicious osseous lesion. Congenitally short pedicles, which narrow the AP diameter of the spinal canal. Conus medullaris and cauda equina: Conus extends to the T12-L1 level. Conus and cauda equina appear normal. Paraspinal and other soft tissues: Negative. Disc levels: T12-L1: No significant disc bulge. Mild facet arthropathy. No spinal canal stenosis or neural foraminal narrowing.  L1-L2: No significant disc bulge. Mild facet arthropathy. No spinal canal stenosis or neural foraminal narrowing. L2-L3: Mild disc bulge. Mild facet arthropathy. Narrowing of the lateral recesses. Mild spinal canal stenosis. No neural foraminal narrowing. L3-L4: Minimal disc bulge. Mild facet arthropathy. Narrowing of the lateral recesses. No spinal canal stenosis or neural foraminal narrowing. L4-L5: Minimal disc bulge with right foraminal annular fissure. No spinal canal stenosis. Mild right neural foraminal narrowing. L5-S1: No significant disc bulge. No spinal canal stenosis or neural foraminal narrowing. IMPRESSION: 1. No acute fracture or traumatic listhesis in the thoracic or lumbar spine. 2. T11-T12 mild right neural foraminal narrowing. No significant spinal canal stenosis in the thoracic spine. 3. L2-L3 mild spinal canal stenosis. 4. L4-L5 mild right neural foraminal narrowing.  A right foraminal annular fissure could also irritate the exiting right L4 nerve. 5. Narrowing of the lateral recesses at L2-L3 and L3-L4 could affect the descending L3 and L4 nerve roots, respectively. 6. Multilevel facet arthropathy, which can be a cause of back pain. Electronically Signed   By: Wiliam Ke M.D.   On: 08/13/2022 23:21   MR LUMBAR SPINE WO CONTRAST  Result Date: 08/13/2022 CLINICAL DATA:  Concern for spinal cord compression, work injury EXAM: MRI THORACIC AND LUMBAR SPINE WITHOUT CONTRAST TECHNIQUE: Multiplanar and multiecho pulse sequences of the thoracic and lumbar spine were obtained without intravenous contrast. COMPARISON:  None Available. FINDINGS: MRI THORACIC SPINE FINDINGS Alignment: No significant listhesis. Preservation of the normal thoracic kyphosis. Mild S-shaped curvature of the thoracolumbar spine. Vertebrae: No acute fracture, evidence of discitis, or suspicious osseous lesion. Cord:  Normal signal and morphology.  No epidural collection. Paraspinal and other soft tissues: Negative. Disc  levels: Minimal disc bulges or protrusions at T1-T2, T2-T3, T3-T4, T6-T7, T8-T9, and T11-T12, and mild facet arthropathy at T9-T10, T10-T11, and T11-T12, without significant spinal canal stenosis. Mild right neural foraminal narrowing at T11-T12. MRI LUMBAR SPINE FINDINGS Segmentation:  5 lumbar type vertebral bodies. Alignment:  No significant listhesis.  Mild levocurvature. Vertebrae: No acute fracture, evidence of discitis, or suspicious osseous lesion. Congenitally short pedicles, which narrow the AP diameter of the spinal canal. Conus medullaris and cauda equina: Conus extends to the T12-L1 level. Conus and cauda equina appear normal. Paraspinal and other soft tissues: Negative. Disc levels: T12-L1: No significant disc bulge. Mild facet arthropathy. No spinal canal stenosis or neural foraminal narrowing. L1-L2: No significant disc bulge. Mild facet arthropathy. No spinal canal stenosis or neural foraminal narrowing. L2-L3: Mild disc bulge. Mild facet arthropathy. Narrowing of the lateral recesses. Mild spinal canal stenosis. No neural foraminal narrowing. L3-L4: Minimal disc bulge. Mild facet arthropathy. Narrowing of the lateral recesses. No spinal canal stenosis or neural foraminal narrowing. L4-L5: Minimal disc bulge with right foraminal annular fissure. No spinal canal stenosis. Mild right neural foraminal narrowing. L5-S1: No significant disc bulge. No spinal canal stenosis or neural foraminal narrowing. IMPRESSION: 1. No acute fracture or traumatic listhesis in the thoracic or lumbar spine. 2. T11-T12 mild right neural foraminal narrowing. No significant spinal canal stenosis in the thoracic spine. 3. L2-L3 mild spinal canal stenosis. 4. L4-L5 mild right neural foraminal narrowing. A right foraminal annular fissure could also irritate the exiting right L4 nerve. 5. Narrowing of the lateral recesses at L2-L3 and L3-L4 could affect the descending L3 and L4 nerve roots, respectively. 6. Multilevel facet  arthropathy, which can be a cause of back pain. Electronically Signed   By: Wiliam Ke M.D.   On: 08/13/2022 23:21    Procedures Procedures    Medications Ordered in ED Medications - No data to display  ED Course/ Medical Decision Making/ A&P                             Medical Decision Making Amount and/or Complexity of Data Reviewed Radiology: ordered. Decision-making details documented in ED Course.   Medical Decision Making:   Eric Noble is a 52 y.o. male who presented to the ED today with back pain, paresthesias, leg weakness detailed above.    Patient's presentation is complicated by their history of possible traumatic injury to the back.  Complete initial physical exam performed, notably the patient  was in no acute distress.  He  did have mild weakness to both the right leg and right arm.  Subjective diminished sensation to the trunk and lower extremity on the right.  Cranial nerves intact.  Patient alert and oriented.  Patient able to ambulate without signs of distress or significant gait disturbances.    Reviewed and confirmed nursing documentation for past medical history, family history, social history.    Initial Assessment:   With the patient's presentation of back pain, differential diagnosis includes but is not limited to disc herniation, spinal stenosis, muscle strain, epidural compression syndrome, spinal abscess, metastatic cancer, discitis, spinal cord injury. This is most consistent with an acute complicated illness  Initial Plan:  MRI thoracic and lumbar spine for further assessment Offered pain management but patient declined Objective evaluation as reviewed   Initial Study Results:   Radiology:  All images reviewed independently. Agree with radiology report at this time.   MR THORACIC SPINE WO CONTRAST  Result Date: 08/13/2022 CLINICAL DATA:  Concern for spinal cord compression, work injury EXAM: MRI THORACIC AND LUMBAR SPINE WITHOUT CONTRAST  TECHNIQUE: Multiplanar and multiecho pulse sequences of the thoracic and lumbar spine were obtained without intravenous contrast. COMPARISON:  None Available. FINDINGS: MRI THORACIC SPINE FINDINGS Alignment: No significant listhesis. Preservation of the normal thoracic kyphosis. Mild S-shaped curvature of the thoracolumbar spine. Vertebrae: No acute fracture, evidence of discitis, or suspicious osseous lesion. Cord:  Normal signal and morphology.  No epidural collection. Paraspinal and other soft tissues: Negative. Disc levels: Minimal disc bulges or protrusions at T1-T2, T2-T3, T3-T4, T6-T7, T8-T9, and T11-T12, and mild facet arthropathy at T9-T10, T10-T11, and T11-T12, without significant spinal canal stenosis. Mild right neural foraminal narrowing at T11-T12. MRI LUMBAR SPINE FINDINGS Segmentation:  5 lumbar type vertebral bodies. Alignment:  No significant listhesis.  Mild levocurvature. Vertebrae: No acute fracture, evidence of discitis, or suspicious osseous lesion. Congenitally short pedicles, which narrow the AP diameter of the spinal canal. Conus medullaris and cauda equina: Conus extends to the T12-L1 level. Conus and cauda equina appear normal. Paraspinal and other soft tissues: Negative. Disc levels: T12-L1: No significant disc bulge. Mild facet arthropathy. No spinal canal stenosis or neural foraminal narrowing. L1-L2: No significant disc bulge. Mild facet arthropathy. No spinal canal stenosis or neural foraminal narrowing. L2-L3: Mild disc bulge. Mild facet arthropathy. Narrowing of the lateral recesses. Mild spinal canal stenosis. No neural foraminal narrowing. L3-L4: Minimal disc bulge. Mild facet arthropathy. Narrowing of the lateral recesses. No spinal canal stenosis or neural foraminal narrowing. L4-L5: Minimal disc bulge with right foraminal annular fissure. No spinal canal stenosis. Mild right neural foraminal narrowing. L5-S1: No significant disc bulge. No spinal canal stenosis or neural  foraminal narrowing. IMPRESSION: 1. No acute fracture or traumatic listhesis in the thoracic or lumbar spine. 2. T11-T12 mild right neural foraminal narrowing. No significant spinal canal stenosis in the thoracic spine. 3. L2-L3 mild spinal canal stenosis. 4. L4-L5 mild right neural foraminal narrowing. A right foraminal annular fissure could also irritate the exiting right L4 nerve. 5. Narrowing of the lateral recesses at L2-L3 and L3-L4 could affect the descending L3 and L4 nerve roots, respectively. 6. Multilevel facet arthropathy, which can be a cause of back pain. Electronically Signed   By: Wiliam Ke M.D.   On: 08/13/2022 23:21   MR LUMBAR SPINE WO CONTRAST  Result Date: 08/13/2022 CLINICAL DATA:  Concern for spinal cord compression, work injury EXAM: MRI THORACIC AND LUMBAR SPINE WITHOUT CONTRAST TECHNIQUE: Multiplanar and multiecho pulse sequences of the thoracic  and lumbar spine were obtained without intravenous contrast. COMPARISON:  None Available. FINDINGS: MRI THORACIC SPINE FINDINGS Alignment: No significant listhesis. Preservation of the normal thoracic kyphosis. Mild S-shaped curvature of the thoracolumbar spine. Vertebrae: No acute fracture, evidence of discitis, or suspicious osseous lesion. Cord:  Normal signal and morphology.  No epidural collection. Paraspinal and other soft tissues: Negative. Disc levels: Minimal disc bulges or protrusions at T1-T2, T2-T3, T3-T4, T6-T7, T8-T9, and T11-T12, and mild facet arthropathy at T9-T10, T10-T11, and T11-T12, without significant spinal canal stenosis. Mild right neural foraminal narrowing at T11-T12. MRI LUMBAR SPINE FINDINGS Segmentation:  5 lumbar type vertebral bodies. Alignment:  No significant listhesis.  Mild levocurvature. Vertebrae: No acute fracture, evidence of discitis, or suspicious osseous lesion. Congenitally short pedicles, which narrow the AP diameter of the spinal canal. Conus medullaris and cauda equina: Conus extends to the  T12-L1 level. Conus and cauda equina appear normal. Paraspinal and other soft tissues: Negative. Disc levels: T12-L1: No significant disc bulge. Mild facet arthropathy. No spinal canal stenosis or neural foraminal narrowing. L1-L2: No significant disc bulge. Mild facet arthropathy. No spinal canal stenosis or neural foraminal narrowing. L2-L3: Mild disc bulge. Mild facet arthropathy. Narrowing of the lateral recesses. Mild spinal canal stenosis. No neural foraminal narrowing. L3-L4: Minimal disc bulge. Mild facet arthropathy. Narrowing of the lateral recesses. No spinal canal stenosis or neural foraminal narrowing. L4-L5: Minimal disc bulge with right foraminal annular fissure. No spinal canal stenosis. Mild right neural foraminal narrowing. L5-S1: No significant disc bulge. No spinal canal stenosis or neural foraminal narrowing. IMPRESSION: 1. No acute fracture or traumatic listhesis in the thoracic or lumbar spine. 2. T11-T12 mild right neural foraminal narrowing. No significant spinal canal stenosis in the thoracic spine. 3. L2-L3 mild spinal canal stenosis. 4. L4-L5 mild right neural foraminal narrowing. A right foraminal annular fissure could also irritate the exiting right L4 nerve. 5. Narrowing of the lateral recesses at L2-L3 and L3-L4 could affect the descending L3 and L4 nerve roots, respectively. 6. Multilevel facet arthropathy, which can be a cause of back pain. Electronically Signed   By: Wiliam Ke M.D.   On: 08/13/2022 23:21    Final Assessment and Plan:   This is a 52 year old male who presents to the ED for evaluation of lower back pain as well as paresthesias and right leg weakness.  He notes that symptoms started after a potential work accident in early March 2024.  He has been treated in the outpatient setting with Flexeril and prednisone and reports fairly good relief with these medications but was sent to the ED today for further evaluation due to new onset of paresthesias and extremity  weakness.  He reports mild weakness in the right upper extremity but more significant weakness in the right lower extremity.  On exam, patient has mildly decreased grip strength on the right and 4/5 strength to the right lower extremity compared to 5/5 to the left lower extremity.  Subjective diminished sensation on the right side of the trunk and right lower extremity.  Reflexes intact.  Cranial nerves intact.  Patient is able to ambulate with a steady gait.  No bowel or bladder dysfunction.  No history of IV drug use or metastatic cancer.  MRI obtained for further assessment.  MRI shows some mild spinal stenosis but no other significant or emergent findings.  Patient does report that he is overall improving with use of Flexeril, prednisone, and outpatient physical therapy.  He has follow-up appointment scheduled with his  PCP for tomorrow.  Discussed findings with patient and will provide him with follow-up for neurosurgery.  Discussed that he should closely follow-up with his PCP as scheduled tomorrow to discuss if he should continue Flexeril and prednisone.  Patient expressed understanding and happy with plan.  Strict ED return precautions given, all questions answered, and stable for discharge.   Clinical Impression:  1. Spinal stenosis of lumbar region without neurogenic claudication      Discharge           Final Clinical Impression(s) / ED Diagnoses Final diagnoses:  Spinal stenosis of lumbar region without neurogenic claudication    Rx / DC Orders ED Discharge Orders     None         Tonette Lederer, PA-C 08/14/22 0015    Glyn Ade, MD 08/17/22 [phone]

## 2022-08-13 NOTE — ED Notes (Signed)
This RN has contacted MRI technicians to check on the status; was told they will come get him in about 30 mins. Pt updated.

## 2022-08-13 NOTE — Discharge Instructions (Signed)
Thank you for letting us take care of you today.  The MRI showed some spinal stenosis but no other significant findings.  Please follow-up with your PCP as scheduled tomorrow.  Discussed whether you should continue the prednisone and Flexeril at that visit.  I also think you would benefit from following up with neurosurgery.  I provided information for the neurosurgeon we have on call.  Please call his office and schedule a follow-up appointment.  Continue with physical therapy as recommended by your outpatient providers.  For any new or worsening symptoms such as difficulty walking, worsening weakness, increased paresthesias, injury to the back, fever, bowel or bladder dysfunction, or other new concerning symptoms, please return to the nearest emergency department for reevaluation.

## 2022-08-13 NOTE — ED Provider Notes (Incomplete)
[facility] Provider Note   CSN: [id number] Arrival date & time: 08/13/22  1654     History {Add pertinent medical, surgical, social history, OB history to HPI:1} Chief Complaint  Patient presents with  . Back Pain    Eric Noble is a 52 y.o. male with no past medical history presents to the ED complaining of lower back pain, decreased sensation, and weakness in his right leg.  Patient states that he began to have lower back pain after a work injury on March 6.  He was evaluated at urgent care for this and diagnosed with sciatica.  He has been taking prednisone and Flexeril but symptoms have not resolved.  He states the pain in his lower back is not excruciating, however, he has had worsening symptoms including feeling like he is weak in his right leg when he tries to walk and decrease sensation from the right side of his mid abdomen all the way down his right leg.  Patient denies fever, chills, bowel or bladder dysfunction, dysuria, hematuria, abdominal pain, chest pain, or shortness of breath.  He denies history of malignancy or IV drug use.  He denies previous history of problems with his back.      Home Medications Prior to Admission medications   Medication Sig Start Date End Date Taking? Authorizing Provider  cyclobenzaprine (FLEXERIL) 10 MG tablet Take 1 tablet by mouth before bed as needed for muscle spasm. Warning: May cause drowsiness. 07/29/22   Vanessa Kick, MD  ibuprofen (ADVIL) 800 MG tablet Take 1 tablet (800 mg total) by mouth 3 (three) times daily. 04/25/20   Wieters, Hallie C, PA-C  omeprazole (PRILOSEC) 20 MG capsule Take 1 capsule (20 mg total) by mouth daily. 06/15/17   Duanne Guess, PA-C  predniSONE (STERAPRED UNI-PAK 48 TAB) 10 MG (48) TBPK tablet Take as directed. 07/29/22   Vanessa Kick, MD      Allergies    Patient has no known allergies.    Review of Systems   Review of Systems  All other systems reviewed  and are negative.   Physical Exam Updated Vital Signs BP (!) 146/87   Pulse 72   Temp 97.7 F (36.5 C) (Oral)   Resp 16   Ht 5\' 11"  (1.803 m)   Wt 72.6 kg   SpO2 100%   BMI 22.32 kg/m  Physical Exam Vitals and nursing note reviewed.  Constitutional:      General: He is not in acute distress.    Appearance: Normal appearance.  HENT:     Head: Normocephalic and atraumatic.     Mouth/Throat:     Mouth: Mucous membranes are moist.  Eyes:     Extraocular Movements: Extraocular movements intact.     Conjunctiva/sclera: Conjunctivae normal.     Pupils: Pupils are equal, round, and reactive to light.  Neck:     Comments: No midline cervical tenderness, step-offs, or deformities Cardiovascular:     Rate and Rhythm: Normal rate and regular rhythm.     Heart sounds: No murmur heard. Pulmonary:     Effort: Pulmonary effort is normal.     Breath sounds: Normal breath sounds.  Abdominal:     General: Abdomen is flat.     Palpations: Abdomen is soft.     Tenderness: There is no abdominal tenderness. There is no guarding or rebound.  Musculoskeletal:        General: Normal range of motion.  Cervical back: Normal range of motion and neck supple. No rigidity or tenderness.     Right lower leg: No edema.     Left lower leg: No edema.     Comments: Minimal tenderness over L5-S1, no tenderness to remainder of midline spine, no stepoffs or deformities, 5/5 strength to the left upper and left lower extremity, 4/5 strength to the right lower extremity, 4+/5 slightly decreased grip strength on the right compared to the left  Skin:    General: Skin is warm and dry.     Capillary Refill: Capillary refill takes less than 2 seconds.  Neurological:     Mental Status: He is alert and oriented to person, place, and time.     GCS: GCS eye subscore is 4. GCS verbal subscore is 5. GCS motor subscore is 6.     Cranial Nerves: Cranial nerves 2-12 are intact. No cranial nerve deficit, dysarthria or  facial asymmetry.     Sensory: Sensory deficit (pt reports diminished sensation to right side of trunk and right lower extremity) present.  Psychiatric:        Behavior: Behavior normal.     ED Results / Procedures / Treatments   Labs (all labs ordered are listed, but only abnormal results are displayed) Labs Reviewed - No data to display  EKG None  Radiology MR THORACIC SPINE WO CONTRAST  Result Date: 08/13/2022 CLINICAL DATA:  Concern for spinal cord compression, work injury EXAM: MRI THORACIC AND LUMBAR SPINE WITHOUT CONTRAST TECHNIQUE: Multiplanar and multiecho pulse sequences of the thoracic and lumbar spine were obtained without intravenous contrast. COMPARISON:  None Available. FINDINGS: MRI THORACIC SPINE FINDINGS Alignment: No significant listhesis. Preservation of the normal thoracic kyphosis. Mild S-shaped curvature of the thoracolumbar spine. Vertebrae: No acute fracture, evidence of discitis, or suspicious osseous lesion. Cord:  Normal signal and morphology.  No epidural collection. Paraspinal and other soft tissues: Negative. Disc levels: Minimal disc bulges or protrusions at T1-T2, T2-T3, T3-T4, T6-T7, T8-T9, and T11-T12, and mild facet arthropathy at T9-T10, T10-T11, and T11-T12, without significant spinal canal stenosis. Mild right neural foraminal narrowing at T11-T12. MRI LUMBAR SPINE FINDINGS Segmentation:  5 lumbar type vertebral bodies. Alignment:  No significant listhesis.  Mild levocurvature. Vertebrae: No acute fracture, evidence of discitis, or suspicious osseous lesion. Congenitally short pedicles, which narrow the AP diameter of the spinal canal. Conus medullaris and cauda equina: Conus extends to the T12-L1 level. Conus and cauda equina appear normal. Paraspinal and other soft tissues: Negative. Disc levels: T12-L1: No significant disc bulge. Mild facet arthropathy. No spinal canal stenosis or neural foraminal narrowing. L1-L2: No significant disc bulge. Mild facet  arthropathy. No spinal canal stenosis or neural foraminal narrowing. L2-L3: Mild disc bulge. Mild facet arthropathy. Narrowing of the lateral recesses. Mild spinal canal stenosis. No neural foraminal narrowing. L3-L4: Minimal disc bulge. Mild facet arthropathy. Narrowing of the lateral recesses. No spinal canal stenosis or neural foraminal narrowing. L4-L5: Minimal disc bulge with right foraminal annular fissure. No spinal canal stenosis. Mild right neural foraminal narrowing. L5-S1: No significant disc bulge. No spinal canal stenosis or neural foraminal narrowing. IMPRESSION: 1. No acute fracture or traumatic listhesis in the thoracic or lumbar spine. 2. T11-T12 mild right neural foraminal narrowing. No significant spinal canal stenosis in the thoracic spine. 3. L2-L3 mild spinal canal stenosis. 4. L4-L5 mild right neural foraminal narrowing. A right foraminal annular fissure could also irritate the exiting right L4 nerve. 5. Narrowing of the lateral recesses at L2-L3 and  L3-L4 could affect the descending L3 and L4 nerve roots, respectively. 6. Multilevel facet arthropathy, which can be a cause of back pain. Electronically Signed   By: Merilyn Baba M.D.   On: 08/13/2022 23:21   MR LUMBAR SPINE WO CONTRAST  Result Date: 08/13/2022 CLINICAL DATA:  Concern for spinal cord compression, work injury EXAM: MRI THORACIC AND LUMBAR SPINE WITHOUT CONTRAST TECHNIQUE: Multiplanar and multiecho pulse sequences of the thoracic and lumbar spine were obtained without intravenous contrast. COMPARISON:  None Available. FINDINGS: MRI THORACIC SPINE FINDINGS Alignment: No significant listhesis. Preservation of the normal thoracic kyphosis. Mild S-shaped curvature of the thoracolumbar spine. Vertebrae: No acute fracture, evidence of discitis, or suspicious osseous lesion. Cord:  Normal signal and morphology.  No epidural collection. Paraspinal and other soft tissues: Negative. Disc levels: Minimal disc bulges or protrusions at  T1-T2, T2-T3, T3-T4, T6-T7, T8-T9, and T11-T12, and mild facet arthropathy at T9-T10, T10-T11, and T11-T12, without significant spinal canal stenosis. Mild right neural foraminal narrowing at T11-T12. MRI LUMBAR SPINE FINDINGS Segmentation:  5 lumbar type vertebral bodies. Alignment:  No significant listhesis.  Mild levocurvature. Vertebrae: No acute fracture, evidence of discitis, or suspicious osseous lesion. Congenitally short pedicles, which narrow the AP diameter of the spinal canal. Conus medullaris and cauda equina: Conus extends to the T12-L1 level. Conus and cauda equina appear normal. Paraspinal and other soft tissues: Negative. Disc levels: T12-L1: No significant disc bulge. Mild facet arthropathy. No spinal canal stenosis or neural foraminal narrowing. L1-L2: No significant disc bulge. Mild facet arthropathy. No spinal canal stenosis or neural foraminal narrowing. L2-L3: Mild disc bulge. Mild facet arthropathy. Narrowing of the lateral recesses. Mild spinal canal stenosis. No neural foraminal narrowing. L3-L4: Minimal disc bulge. Mild facet arthropathy. Narrowing of the lateral recesses. No spinal canal stenosis or neural foraminal narrowing. L4-L5: Minimal disc bulge with right foraminal annular fissure. No spinal canal stenosis. Mild right neural foraminal narrowing. L5-S1: No significant disc bulge. No spinal canal stenosis or neural foraminal narrowing. IMPRESSION: 1. No acute fracture or traumatic listhesis in the thoracic or lumbar spine. 2. T11-T12 mild right neural foraminal narrowing. No significant spinal canal stenosis in the thoracic spine. 3. L2-L3 mild spinal canal stenosis. 4. L4-L5 mild right neural foraminal narrowing. A right foraminal annular fissure could also irritate the exiting right L4 nerve. 5. Narrowing of the lateral recesses at L2-L3 and L3-L4 could affect the descending L3 and L4 nerve roots, respectively. 6. Multilevel facet arthropathy, which can be a cause of back pain.  Electronically Signed   By: Merilyn Baba M.D.   On: 08/13/2022 23:21    Procedures Procedures  {Document cardiac monitor, telemetry assessment procedure when appropriate:1}  Medications Ordered in ED Medications - No data to display  ED Course/ Medical Decision Making/ A&P   {   Click here for ABCD2, HEART and other calculatorsREFRESH Note before signing :1}                          Medical Decision Making Amount and/or Complexity of Data Reviewed Radiology: ordered. Decision-making details documented in ED Course.   Medical Decision Making:   Eric Noble is a 52 y.o. male who presented to the ED today with *** detailed above.    {crccomplexity:27900} Complete initial physical exam performed, notably the patient  was ***.    Reviewed and confirmed nursing documentation for past medical history, family history, social history.    Initial Assessment:   With  the patient's presentation of ***, most likely diagnosis is ***. Differential diagnosis includes but is not limited to {crccopa:27899}  Initial Plan:  Screening labs including CBC and Metabolic panel to evaluate for infectious or metabolic etiology of disease.  Urinalysis with reflex culture ordered to evaluate for UTI or relevant urologic/nephrologic pathology.  CXR to evaluate for structural/infectious intrathoracic pathology.  EKG to evaluate for cardiac pathology Objective evaluation as reviewed   Initial Study Results:   Laboratory  All laboratory results reviewed without evidence of clinically relevant pathology.   Exceptions include: ***   EKG EKG was reviewed independently. ST segments without concerns for elevations.   EKG: {ekg findings:315101}.   Radiology:  All images reviewed independently. Agree with radiology report at this time.   MR THORACIC SPINE WO CONTRAST  Result Date: 08/13/2022 CLINICAL DATA:  Concern for spinal cord compression, work injury EXAM: MRI THORACIC AND LUMBAR SPINE WITHOUT  CONTRAST TECHNIQUE: Multiplanar and multiecho pulse sequences of the thoracic and lumbar spine were obtained without intravenous contrast. COMPARISON:  None Available. FINDINGS: MRI THORACIC SPINE FINDINGS Alignment: No significant listhesis. Preservation of the normal thoracic kyphosis. Mild S-shaped curvature of the thoracolumbar spine. Vertebrae: No acute fracture, evidence of discitis, or suspicious osseous lesion. Cord:  Normal signal and morphology.  No epidural collection. Paraspinal and other soft tissues: Negative. Disc levels: Minimal disc bulges or protrusions at T1-T2, T2-T3, T3-T4, T6-T7, T8-T9, and T11-T12, and mild facet arthropathy at T9-T10, T10-T11, and T11-T12, without significant spinal canal stenosis. Mild right neural foraminal narrowing at T11-T12. MRI LUMBAR SPINE FINDINGS Segmentation:  5 lumbar type vertebral bodies. Alignment:  No significant listhesis.  Mild levocurvature. Vertebrae: No acute fracture, evidence of discitis, or suspicious osseous lesion. Congenitally short pedicles, which narrow the AP diameter of the spinal canal. Conus medullaris and cauda equina: Conus extends to the T12-L1 level. Conus and cauda equina appear normal. Paraspinal and other soft tissues: Negative. Disc levels: T12-L1: No significant disc bulge. Mild facet arthropathy. No spinal canal stenosis or neural foraminal narrowing. L1-L2: No significant disc bulge. Mild facet arthropathy. No spinal canal stenosis or neural foraminal narrowing. L2-L3: Mild disc bulge. Mild facet arthropathy. Narrowing of the lateral recesses. Mild spinal canal stenosis. No neural foraminal narrowing. L3-L4: Minimal disc bulge. Mild facet arthropathy. Narrowing of the lateral recesses. No spinal canal stenosis or neural foraminal narrowing. L4-L5: Minimal disc bulge with right foraminal annular fissure. No spinal canal stenosis. Mild right neural foraminal narrowing. L5-S1: No significant disc bulge. No spinal canal stenosis or  neural foraminal narrowing. IMPRESSION: 1. No acute fracture or traumatic listhesis in the thoracic or lumbar spine. 2. T11-T12 mild right neural foraminal narrowing. No significant spinal canal stenosis in the thoracic spine. 3. L2-L3 mild spinal canal stenosis. 4. L4-L5 mild right neural foraminal narrowing. A right foraminal annular fissure could also irritate the exiting right L4 nerve. 5. Narrowing of the lateral recesses at L2-L3 and L3-L4 could affect the descending L3 and L4 nerve roots, respectively. 6. Multilevel facet arthropathy, which can be a cause of back pain. Electronically Signed   By: Merilyn Baba M.D.   On: 08/13/2022 23:21   MR LUMBAR SPINE WO CONTRAST  Result Date: 08/13/2022 CLINICAL DATA:  Concern for spinal cord compression, work injury EXAM: MRI THORACIC AND LUMBAR SPINE WITHOUT CONTRAST TECHNIQUE: Multiplanar and multiecho pulse sequences of the thoracic and lumbar spine were obtained without intravenous contrast. COMPARISON:  None Available. FINDINGS: MRI THORACIC SPINE FINDINGS Alignment: No significant listhesis. Preservation of the  normal thoracic kyphosis. Mild S-shaped curvature of the thoracolumbar spine. Vertebrae: No acute fracture, evidence of discitis, or suspicious osseous lesion. Cord:  Normal signal and morphology.  No epidural collection. Paraspinal and other soft tissues: Negative. Disc levels: Minimal disc bulges or protrusions at T1-T2, T2-T3, T3-T4, T6-T7, T8-T9, and T11-T12, and mild facet arthropathy at T9-T10, T10-T11, and T11-T12, without significant spinal canal stenosis. Mild right neural foraminal narrowing at T11-T12. MRI LUMBAR SPINE FINDINGS Segmentation:  5 lumbar type vertebral bodies. Alignment:  No significant listhesis.  Mild levocurvature. Vertebrae: No acute fracture, evidence of discitis, or suspicious osseous lesion. Congenitally short pedicles, which narrow the AP diameter of the spinal canal. Conus medullaris and cauda equina: Conus extends to  the T12-L1 level. Conus and cauda equina appear normal. Paraspinal and other soft tissues: Negative. Disc levels: T12-L1: No significant disc bulge. Mild facet arthropathy. No spinal canal stenosis or neural foraminal narrowing. L1-L2: No significant disc bulge. Mild facet arthropathy. No spinal canal stenosis or neural foraminal narrowing. L2-L3: Mild disc bulge. Mild facet arthropathy. Narrowing of the lateral recesses. Mild spinal canal stenosis. No neural foraminal narrowing. L3-L4: Minimal disc bulge. Mild facet arthropathy. Narrowing of the lateral recesses. No spinal canal stenosis or neural foraminal narrowing. L4-L5: Minimal disc bulge with right foraminal annular fissure. No spinal canal stenosis. Mild right neural foraminal narrowing. L5-S1: No significant disc bulge. No spinal canal stenosis or neural foraminal narrowing. IMPRESSION: 1. No acute fracture or traumatic listhesis in the thoracic or lumbar spine. 2. T11-T12 mild right neural foraminal narrowing. No significant spinal canal stenosis in the thoracic spine. 3. L2-L3 mild spinal canal stenosis. 4. L4-L5 mild right neural foraminal narrowing. A right foraminal annular fissure could also irritate the exiting right L4 nerve. 5. Narrowing of the lateral recesses at L2-L3 and L3-L4 could affect the descending L3 and L4 nerve roots, respectively. 6. Multilevel facet arthropathy, which can be a cause of back pain. Electronically Signed   By: Merilyn Baba M.D.   On: 08/13/2022 23:21      Consults: Case discussed with ***.   Final Assessment and Plan:   ***    Clinical Impression:  1. Spinal stenosis of lumbar region without neurogenic claudication      Discharge    {Document critical care time when appropriate:1} {Document review of labs and clinical decision tools ie heart score, Chads2Vasc2 etc:1}  {Document your independent review of radiology images, and any outside records:1} {Document your discussion with family members,  caretakers, and with consultants:1} {Document social determinants of health affecting pt's care:1} {Document your decision making why or why not admission, treatments were needed:1} Final Clinical Impression(s) / ED Diagnoses Final diagnoses:  Spinal stenosis of lumbar region without neurogenic claudication    Rx / DC Orders ED Discharge Orders     None

## 2022-08-13 NOTE — ED Triage Notes (Addendum)
PER EMS: pt sent from Urgent Care due to concern for spinal cord compression. He was sent here to have an MRI. He had a work injury on 3/6, felt something "weird" in his back when he was pushing 500lb of boxes and is getting progressively worse and now has right sided weakness and right foot drop, hard to ambulate. He also reports urinary incontinence and changes in bowel movements.  BP- 144/88, HR-80, RR-16, 100% RA

## 2022-08-13 NOTE — ED Notes (Signed)
Pt is being transported to MRI.

## 2022-08-24 ENCOUNTER — Other Ambulatory Visit: Payer: Self-pay

## 2022-09-14 ENCOUNTER — Inpatient Hospital Stay: Admission: RE | Admit: 2022-09-14 | Payer: Self-pay | Source: Ambulatory Visit

## 2022-10-07 ENCOUNTER — Inpatient Hospital Stay: Admission: RE | Admit: 2022-10-07 | Payer: Self-pay | Source: Ambulatory Visit

## 2023-03-12 DIAGNOSIS — Z419 Encounter for procedure for purposes other than remedying health state, unspecified: Secondary | ICD-10-CM | POA: Diagnosis not present

## 2023-04-11 DIAGNOSIS — Z419 Encounter for procedure for purposes other than remedying health state, unspecified: Secondary | ICD-10-CM | POA: Diagnosis not present

## 2023-05-12 DIAGNOSIS — Z419 Encounter for procedure for purposes other than remedying health state, unspecified: Secondary | ICD-10-CM | POA: Diagnosis not present

## 2023-06-12 DIAGNOSIS — Z419 Encounter for procedure for purposes other than remedying health state, unspecified: Secondary | ICD-10-CM | POA: Diagnosis not present

## 2023-07-10 DIAGNOSIS — Z419 Encounter for procedure for purposes other than remedying health state, unspecified: Secondary | ICD-10-CM | POA: Diagnosis not present

## 2023-08-21 DIAGNOSIS — Z419 Encounter for procedure for purposes other than remedying health state, unspecified: Secondary | ICD-10-CM | POA: Diagnosis not present

## 2023-09-20 DIAGNOSIS — Z419 Encounter for procedure for purposes other than remedying health state, unspecified: Secondary | ICD-10-CM | POA: Diagnosis not present

## 2023-10-21 DIAGNOSIS — Z419 Encounter for procedure for purposes other than remedying health state, unspecified: Secondary | ICD-10-CM | POA: Diagnosis not present

## 2023-11-20 DIAGNOSIS — Z419 Encounter for procedure for purposes other than remedying health state, unspecified: Secondary | ICD-10-CM | POA: Diagnosis not present

## 2023-12-21 DIAGNOSIS — Z419 Encounter for procedure for purposes other than remedying health state, unspecified: Secondary | ICD-10-CM | POA: Diagnosis not present

## 2024-01-21 DIAGNOSIS — Z419 Encounter for procedure for purposes other than remedying health state, unspecified: Secondary | ICD-10-CM | POA: Diagnosis not present

## 2024-03-22 DIAGNOSIS — Z419 Encounter for procedure for purposes other than remedying health state, unspecified: Secondary | ICD-10-CM | POA: Diagnosis not present
